# Patient Record
Sex: Male | Born: 1945 | Race: White | Hispanic: No
Health system: Southern US, Community
[De-identification: ages and names within clinical notes are randomized; demographics above are authoritative.]

---

## 2008-03-21 ENCOUNTER — Ambulatory Visit (HOSPITAL_COMMUNITY): Admission: RE | Admit: 2008-03-21 | Discharge: 2008-03-21 | Payer: Self-pay | Admitting: Urology

## 2010-01-06 IMAGING — CR DG ABDOMEN 1V
1 series · 1 of 1 positions shown · non-contrast
Comparison: None

CLINICAL DATA: Left renal calculus.  Preoperative exam.

ABDOMEN - 1 VIEW

[t abdomen supine]
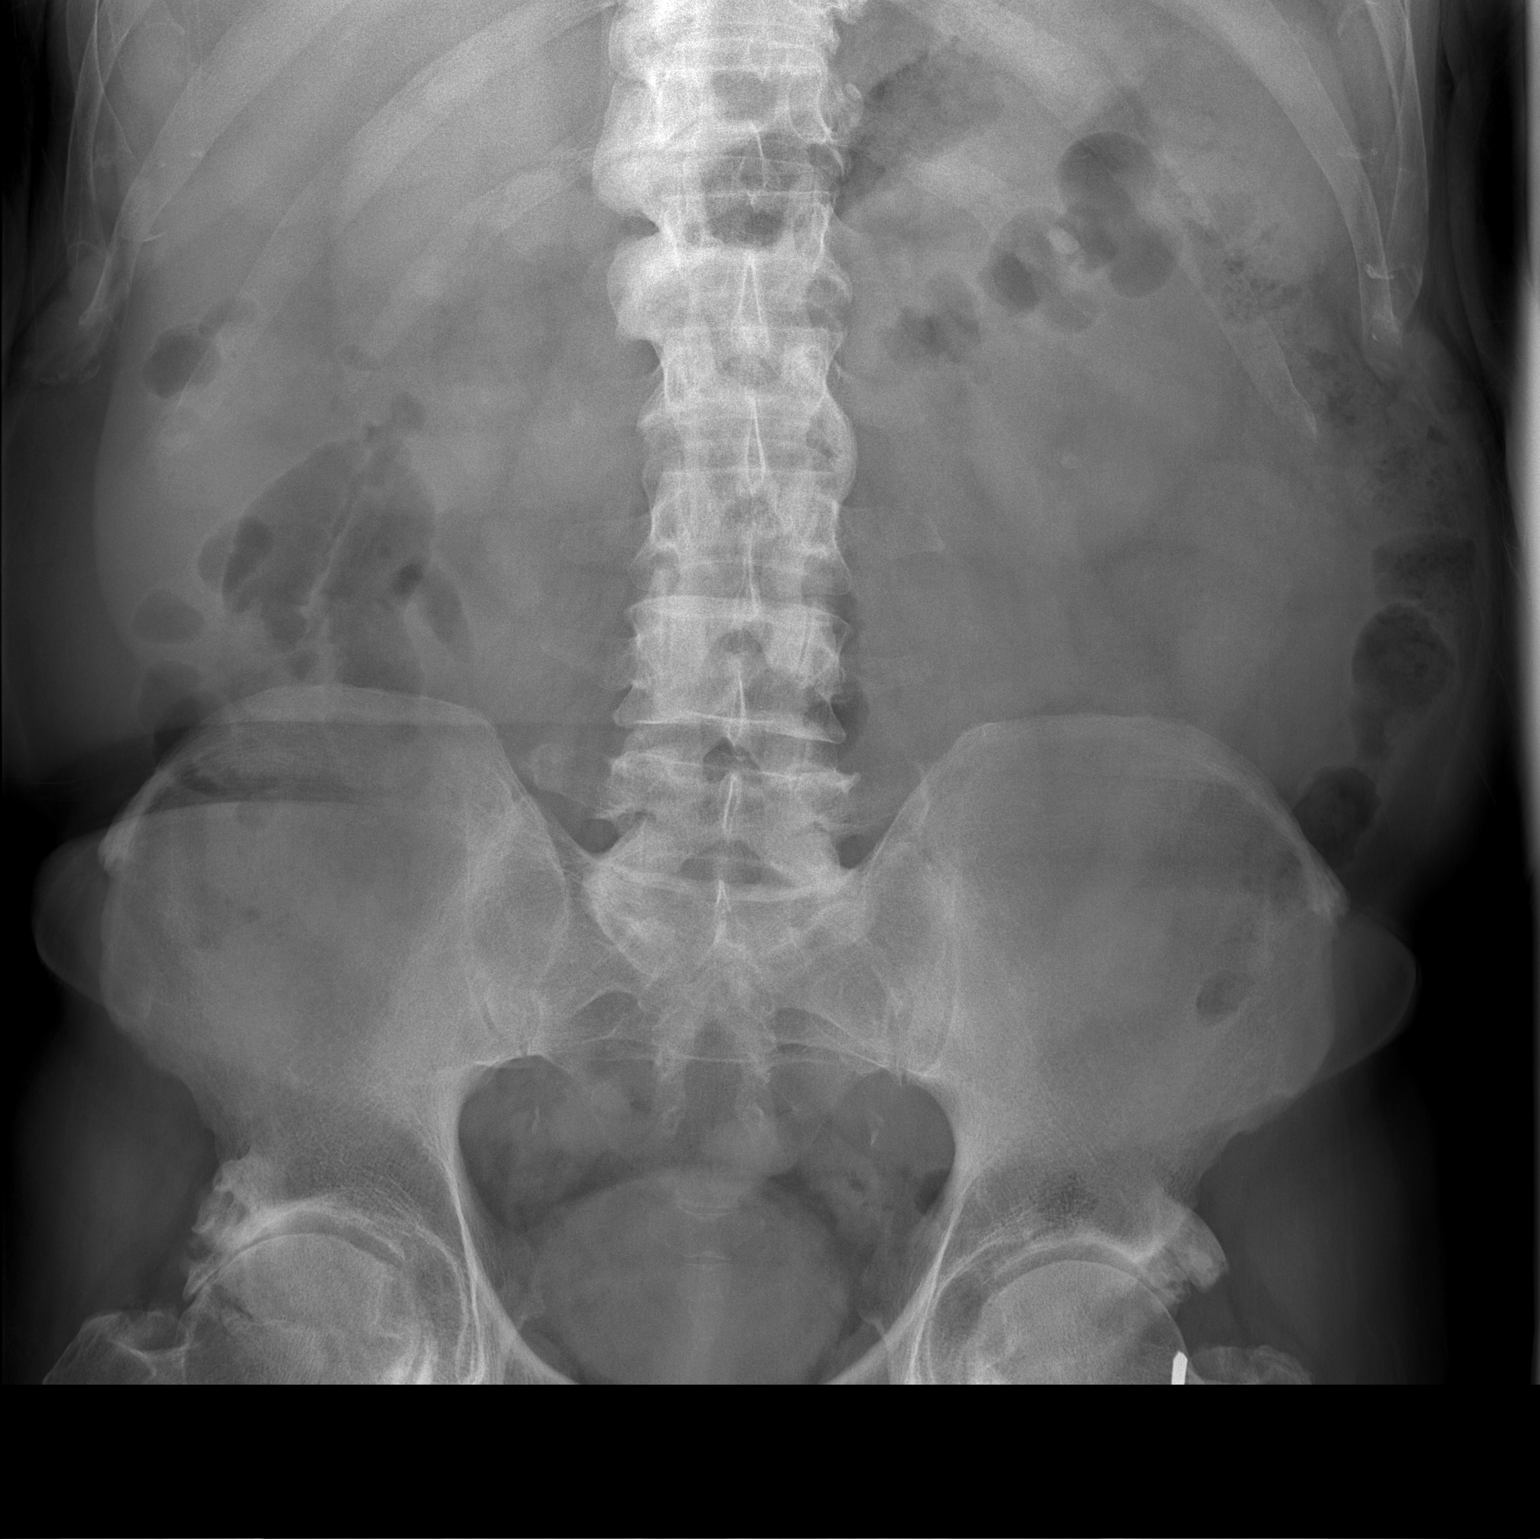

[1 of 1 positions shown; findings below may reference images not displayed]

FINDINGS: A 5 mm calcification overlying the lower pole left kidney
is compatible with a renal calculus.
No other definite calcifications are noted overlying the renal
shadows or expected course of the ureters, but correlate clinically
with any other studies.
The bowel gas pattern is unremarkable.
Moderate to severe degenerative changes within both hips are noted.
IMPRESSION: 5 mm left lower pole renal calculus.

## 2013-11-15 ENCOUNTER — Other Ambulatory Visit: Payer: Self-pay | Admitting: Internal Medicine

## 2013-11-15 DIAGNOSIS — R933 Abnormal findings on diagnostic imaging of other parts of digestive tract: Secondary | ICD-10-CM

## 2013-11-24 ENCOUNTER — Other Ambulatory Visit: Payer: Self-pay | Admitting: Internal Medicine

## 2013-11-24 DIAGNOSIS — R933 Abnormal findings on diagnostic imaging of other parts of digestive tract: Secondary | ICD-10-CM

## 2013-11-24 DIAGNOSIS — Y842 Radiological procedure and radiotherapy as the cause of abnormal reaction of the patient, or of later complication, without mention of misadventure at the time of the procedure: Secondary | ICD-10-CM

## 2013-12-01 ENCOUNTER — Ambulatory Visit
Admission: RE | Admit: 2013-12-01 | Discharge: 2013-12-01 | Disposition: A | Discharge status: Home / Self Care | Payer: Self-pay | Source: Ambulatory Visit | Attending: Internal Medicine | Admitting: Internal Medicine

## 2013-12-01 DIAGNOSIS — Y842 Radiological procedure and radiotherapy as the cause of abnormal reaction of the patient, or of later complication, without mention of misadventure at the time of the procedure: Secondary | ICD-10-CM

## 2013-12-01 DIAGNOSIS — R933 Abnormal findings on diagnostic imaging of other parts of digestive tract: Secondary | ICD-10-CM

## 2014-04-05 NOTE — Addendum Note (Signed)
Encounter addended by: Kellie MoorMichelle Monia Timmers, Rad Tech on: 04/05/2014  8:14 AM<BR>     Documentation filed: Orders

## 2015-09-18 IMAGING — CT CT VIRTUAL COLONOSCOPY SCREENING
3 of 7 series · 12 of 36 positions shown, 18 images · non-contrast
Comparison: None.

CLINICAL DATA: History of adenomatous polyps. History of recent
incomplete colonoscopy

EXAM:
CT VIRTUAL COLONOSCOPY SCREENING
TECHNIQUE: The patient was given a standard magnesium citrate and suppositories
bowel preparation with Gastrografin and barium for fluid and stool
tagging respectively. The quality of the bowel preparation is
moderate to poor. Automated CO2 insufflation of the colon was
performed prior to image acquisition and colonic distention is
moderate. Image post processing was used to generate a 3D
endoluminal fly-through projection of the colon and to
electronically subtract stool/fluid as appropriate.

[Series 6: prone (id) · axial · 0.77mm/px · z∈[-448,-26]mm · 8 of 436 slices shown, 13 images (1 of 2)]
[im 49/436  soft-tissue]
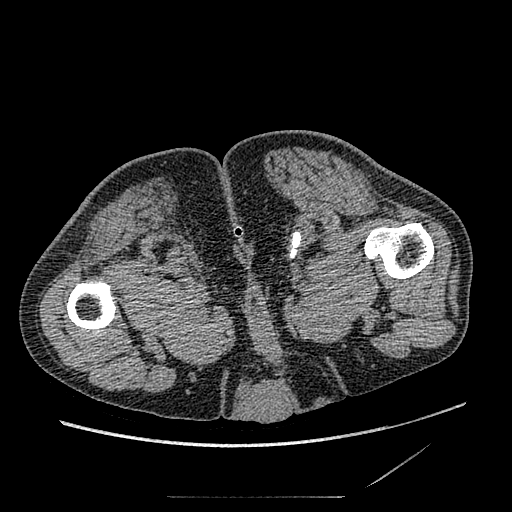
[im 49/436  bone]
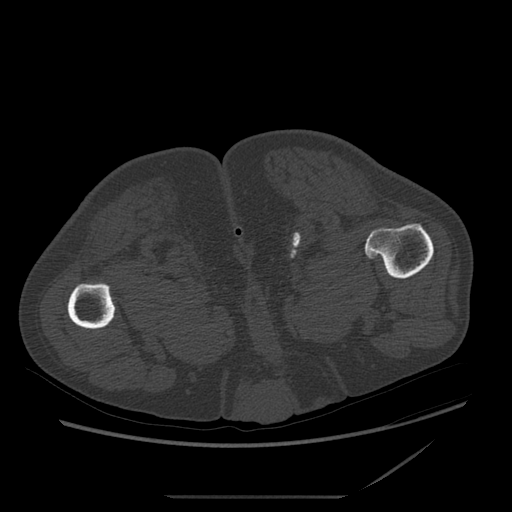
[im 97/436  soft-tissue]
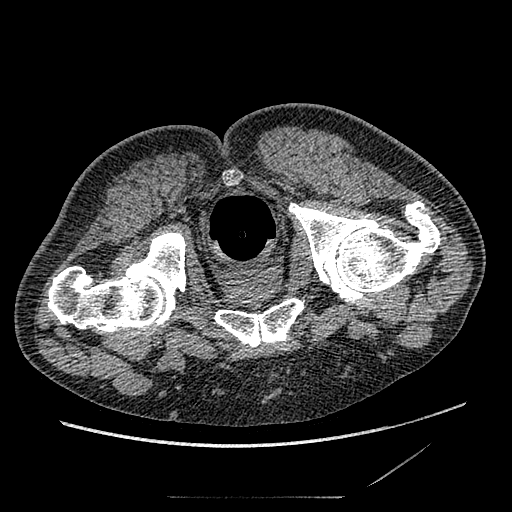
[im 146/436  soft-tissue]
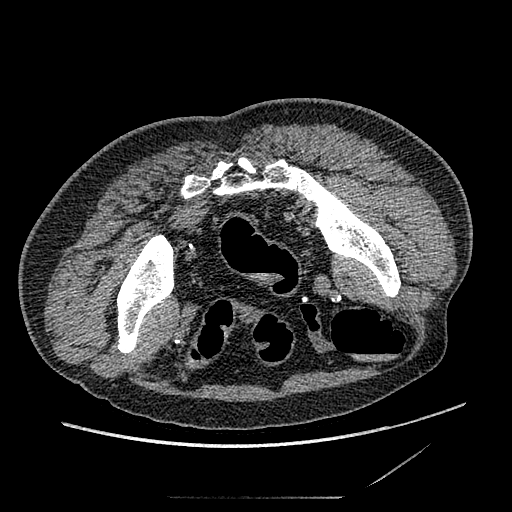
[im 194/436  soft-tissue]
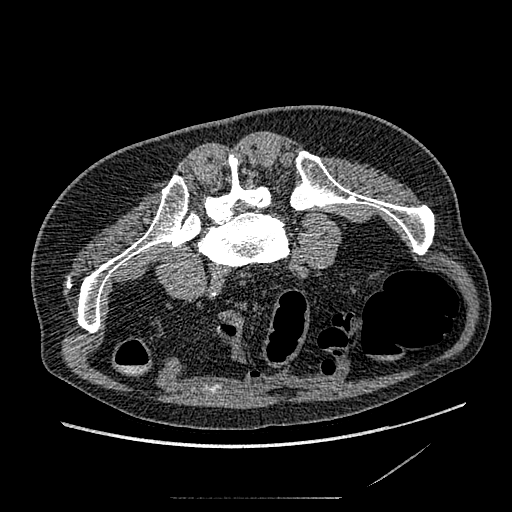
[im 242/436  soft-tissue]
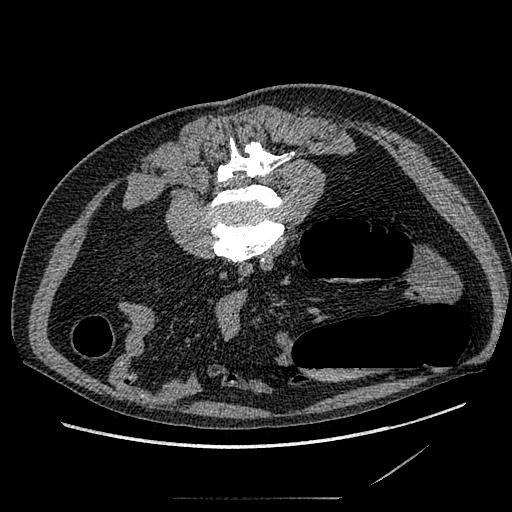
[im 242/436  lung]
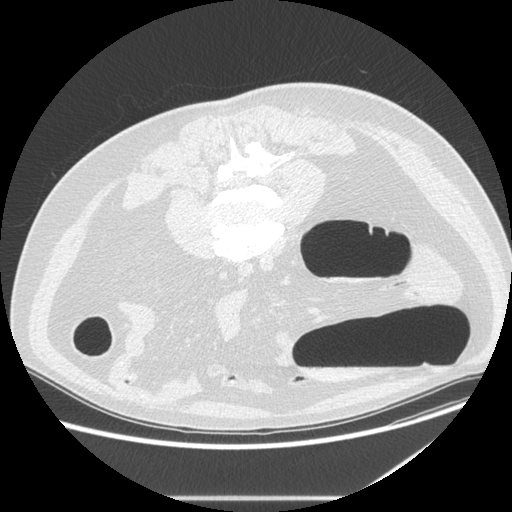
[im 291/436  soft-tissue]
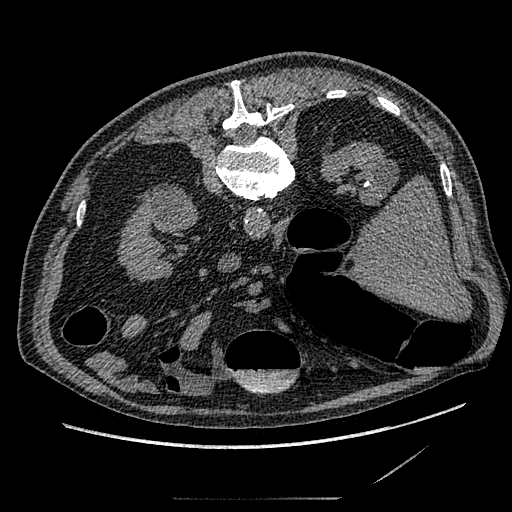
[im 291/436  lung]
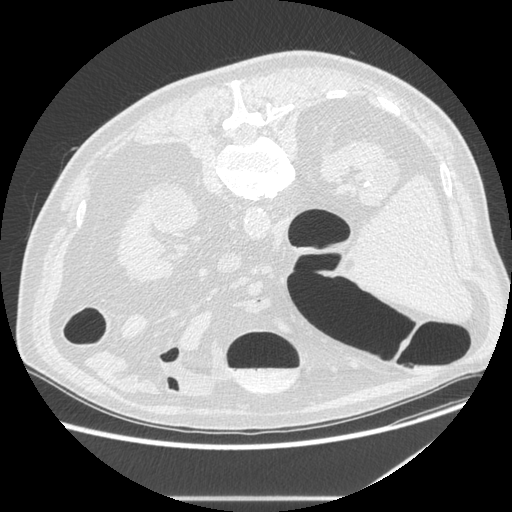
[im 339/436  soft-tissue]
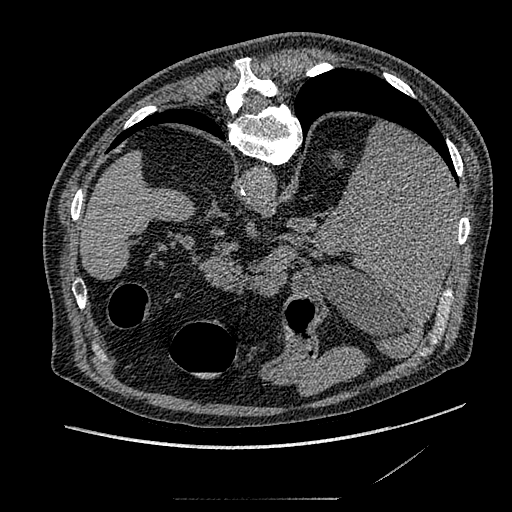
[im 339/436  lung]
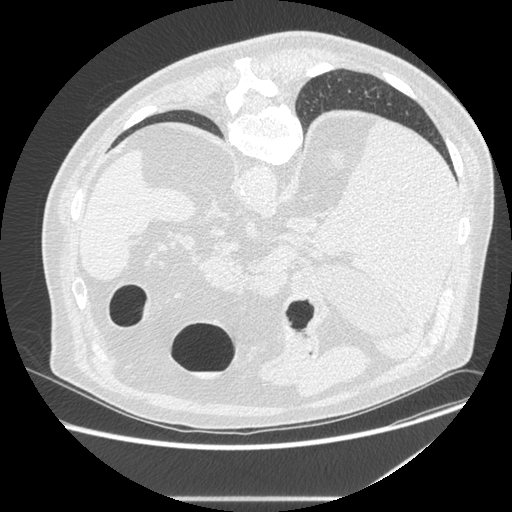
[im 387/436  soft-tissue]
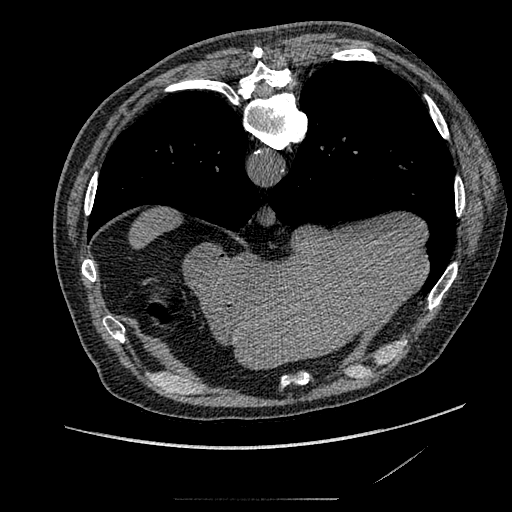
[im 387/436  lung]
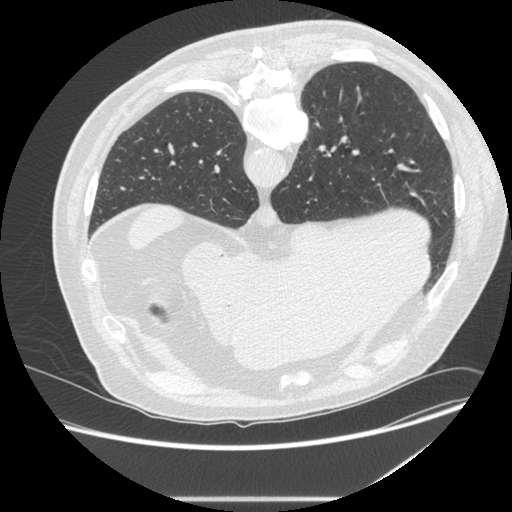

[Series 7: prone (id) · axial · 0.77mm/px · z∈[-372,-102]mm · 3 of 218 slices shown (2 of 2)]
[im 55/218  soft-tissue]
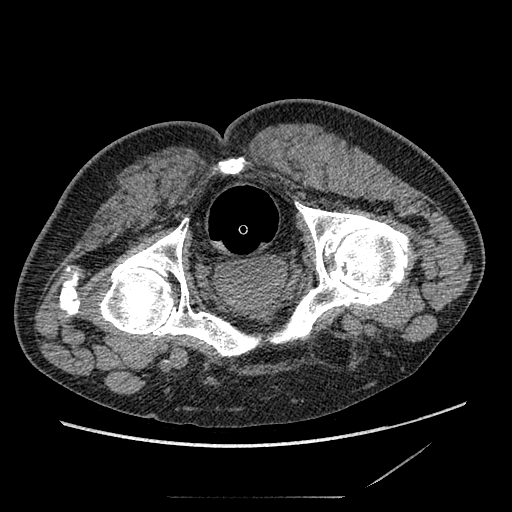
[im 109/218  soft-tissue]
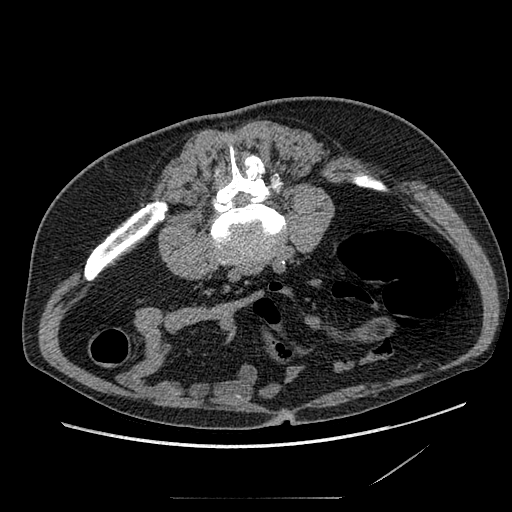
[im 163/218  soft-tissue]
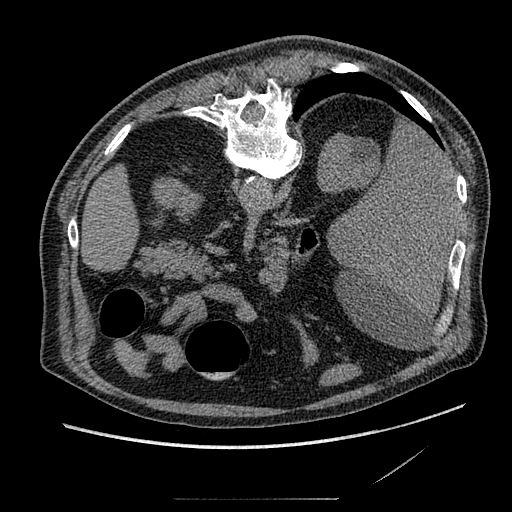

[Series 601: coronal body · coronal · 0.96mm/px · 1 of 126 slices shown, 2 images]
[im 42/126  soft-tissue]
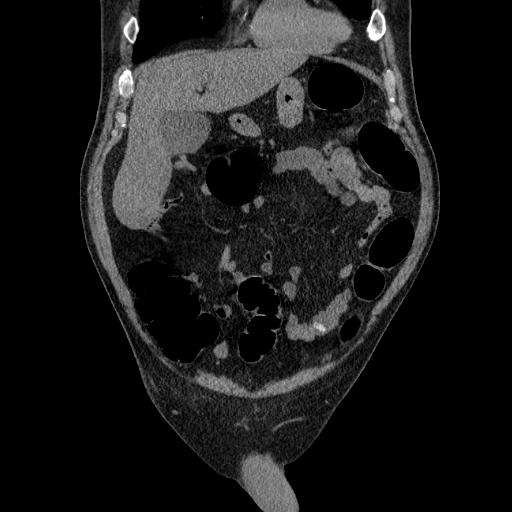
[im 42/126  bone]
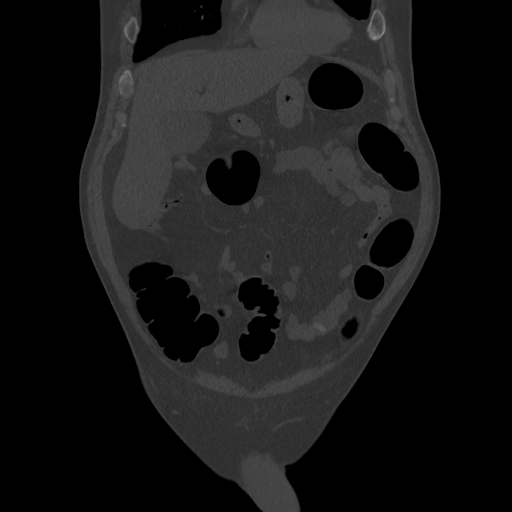

[12 of 36 positions shown; findings below may reference images not displayed]

FINDINGS: The rectosigmoid colon is somewhat elongated and tortuous. There is
some thickening of the mucosa possibly indicating mild
diverticulosis. There is unfortunately a moderate amount of retained
fluid and some feces, all of which appears relatively well tagged.
This makes exclusion of small polyps difficult. However no
clinically significant polypoid lesion or constricting lesion is
seen. The transverse colon and right colon are well visualized with
no suspicious abnormality noted. The ileocecal valve appears normal.
IMPRESSION: There is a moderate amount of retained fluid and some feces which
makes exclusion of small polyps difficult particularly within the
rectosigmoid and descending colon. However no clinically significant
polypoid lesion persists. The rectosigmoid colon is elongated and
tortuous.

Virtual colonoscopy is not designed to detect diminutive polyps
(i.e., less than or equal to 5 mm), the presence or absence of which
may not affect clinical management.

CT ABDOMEN AND PELVIS WITHOUT CONTRAST
FINDINGS: The lung bases are clear. The liver is unremarkable in the
unenhanced state. The gallbladder is somewhat distended but no
calcified gallstones are noted. The pancreas is normal in size and
the pancreatic duct is not dilated. The adrenal glands and spleen
are unremarkable. The stomach is not well distended. There are
bilateral renal calculi present which are nonobstructing.
Low-attenuation renal lesions are noted bilaterally most consistent
with cysts, but difficult to assess on this unenhanced study. The
abdominal aorta is normal in caliber with moderate atheromatous
change present. No adenopathy is seen.

The urinary bladder is decompressed. The prostate is slightly
prominent. The terminal ileum is unremarkable. The appendix also
fills with contrast normally. There are moderate degenerative
changes in both hips with loss of joint space and spurring. Diffuse
degenerative changes noted throughout the lumbar spine.

1. Nonobstructing bilateral renal calculi.
2. Low-attenuation renal lesions bilaterally most consistent with
cysts, but difficult to assess on this unenhanced study.
3. Slight prominence of the prostate.
4. Degenerative changes in both hips and in the lumbar spine.

## 2016-01-30 DIAGNOSIS — G9341 Metabolic encephalopathy: Secondary | ICD-10-CM

## 2016-01-30 DIAGNOSIS — S39012A Strain of muscle, fascia and tendon of lower back, initial encounter: Secondary | ICD-10-CM

## 2016-01-30 DIAGNOSIS — N390 Urinary tract infection, site not specified: Secondary | ICD-10-CM | POA: Diagnosis not present

## 2016-01-30 DIAGNOSIS — D696 Thrombocytopenia, unspecified: Secondary | ICD-10-CM

## 2016-01-30 DIAGNOSIS — E860 Dehydration: Secondary | ICD-10-CM

## 2016-01-30 DIAGNOSIS — W19XXXA Unspecified fall, initial encounter: Secondary | ICD-10-CM

## 2016-01-31 DIAGNOSIS — N390 Urinary tract infection, site not specified: Secondary | ICD-10-CM | POA: Diagnosis not present

## 2016-01-31 DIAGNOSIS — E860 Dehydration: Secondary | ICD-10-CM | POA: Diagnosis not present

## 2016-01-31 DIAGNOSIS — S39012A Strain of muscle, fascia and tendon of lower back, initial encounter: Secondary | ICD-10-CM | POA: Diagnosis not present

## 2016-01-31 DIAGNOSIS — D696 Thrombocytopenia, unspecified: Secondary | ICD-10-CM | POA: Diagnosis not present

## 2016-02-01 DIAGNOSIS — F0390 Unspecified dementia without behavioral disturbance: Secondary | ICD-10-CM

## 2016-02-01 DIAGNOSIS — N390 Urinary tract infection, site not specified: Secondary | ICD-10-CM | POA: Diagnosis not present

## 2016-02-01 DIAGNOSIS — D696 Thrombocytopenia, unspecified: Secondary | ICD-10-CM | POA: Diagnosis not present

## 2016-02-01 DIAGNOSIS — S39012A Strain of muscle, fascia and tendon of lower back, initial encounter: Secondary | ICD-10-CM | POA: Diagnosis not present

## 2016-02-01 DIAGNOSIS — E860 Dehydration: Secondary | ICD-10-CM | POA: Diagnosis not present

## 2016-02-01 DIAGNOSIS — G20 Parkinson's disease: Secondary | ICD-10-CM

## 2016-11-08 DIAGNOSIS — R4689 Other symptoms and signs involving appearance and behavior: Secondary | ICD-10-CM

## 2016-11-08 DIAGNOSIS — W19XXXA Unspecified fall, initial encounter: Secondary | ICD-10-CM

## 2016-11-08 DIAGNOSIS — N390 Urinary tract infection, site not specified: Secondary | ICD-10-CM | POA: Diagnosis not present

## 2016-11-08 DIAGNOSIS — G20 Parkinson's disease: Secondary | ICD-10-CM | POA: Diagnosis not present

## 2016-11-08 DIAGNOSIS — F0390 Unspecified dementia without behavioral disturbance: Secondary | ICD-10-CM | POA: Diagnosis not present

## 2016-11-08 DIAGNOSIS — G9341 Metabolic encephalopathy: Secondary | ICD-10-CM

## 2016-11-09 DIAGNOSIS — G9341 Metabolic encephalopathy: Secondary | ICD-10-CM | POA: Diagnosis not present

## 2016-11-09 DIAGNOSIS — G20 Parkinson's disease: Secondary | ICD-10-CM | POA: Diagnosis not present

## 2016-11-09 DIAGNOSIS — N390 Urinary tract infection, site not specified: Secondary | ICD-10-CM | POA: Diagnosis not present

## 2016-11-09 DIAGNOSIS — R4689 Other symptoms and signs involving appearance and behavior: Secondary | ICD-10-CM | POA: Diagnosis not present

## 2016-11-09 DIAGNOSIS — F0390 Unspecified dementia without behavioral disturbance: Secondary | ICD-10-CM | POA: Diagnosis not present

## 2016-11-09 DIAGNOSIS — W19XXXA Unspecified fall, initial encounter: Secondary | ICD-10-CM | POA: Diagnosis not present

## 2016-11-10 DIAGNOSIS — N390 Urinary tract infection, site not specified: Secondary | ICD-10-CM | POA: Diagnosis not present

## 2016-11-10 DIAGNOSIS — G9341 Metabolic encephalopathy: Secondary | ICD-10-CM | POA: Diagnosis not present

## 2016-11-10 DIAGNOSIS — R4689 Other symptoms and signs involving appearance and behavior: Secondary | ICD-10-CM | POA: Diagnosis not present

## 2016-11-10 DIAGNOSIS — F0390 Unspecified dementia without behavioral disturbance: Secondary | ICD-10-CM | POA: Diagnosis not present

## 2016-11-10 DIAGNOSIS — W19XXXA Unspecified fall, initial encounter: Secondary | ICD-10-CM | POA: Diagnosis not present

## 2016-11-10 DIAGNOSIS — G20 Parkinson's disease: Secondary | ICD-10-CM | POA: Diagnosis not present

## 2016-11-11 DIAGNOSIS — N390 Urinary tract infection, site not specified: Secondary | ICD-10-CM | POA: Diagnosis not present

## 2016-11-11 DIAGNOSIS — R4689 Other symptoms and signs involving appearance and behavior: Secondary | ICD-10-CM | POA: Diagnosis not present

## 2016-11-11 DIAGNOSIS — F0390 Unspecified dementia without behavioral disturbance: Secondary | ICD-10-CM | POA: Diagnosis not present

## 2016-11-11 DIAGNOSIS — G9341 Metabolic encephalopathy: Secondary | ICD-10-CM | POA: Diagnosis not present

## 2016-11-11 DIAGNOSIS — G20 Parkinson's disease: Secondary | ICD-10-CM | POA: Diagnosis not present

## 2016-11-11 DIAGNOSIS — W19XXXA Unspecified fall, initial encounter: Secondary | ICD-10-CM | POA: Diagnosis not present

## 2017-01-16 DIAGNOSIS — G9341 Metabolic encephalopathy: Secondary | ICD-10-CM | POA: Diagnosis not present

## 2017-01-16 DIAGNOSIS — R4689 Other symptoms and signs involving appearance and behavior: Secondary | ICD-10-CM | POA: Diagnosis not present

## 2017-01-16 DIAGNOSIS — F0390 Unspecified dementia without behavioral disturbance: Secondary | ICD-10-CM | POA: Diagnosis not present

## 2017-01-16 DIAGNOSIS — E860 Dehydration: Secondary | ICD-10-CM | POA: Diagnosis not present

## 2017-01-16 DIAGNOSIS — D696 Thrombocytopenia, unspecified: Secondary | ICD-10-CM | POA: Diagnosis not present

## 2017-01-16 DIAGNOSIS — G20 Parkinson's disease: Secondary | ICD-10-CM | POA: Diagnosis not present

## 2017-01-16 DIAGNOSIS — J159 Unspecified bacterial pneumonia: Secondary | ICD-10-CM | POA: Diagnosis not present

## 2017-01-16 DIAGNOSIS — N390 Urinary tract infection, site not specified: Secondary | ICD-10-CM

## 2017-01-16 DIAGNOSIS — R410 Disorientation, unspecified: Secondary | ICD-10-CM | POA: Diagnosis not present

## 2017-01-17 DIAGNOSIS — J159 Unspecified bacterial pneumonia: Secondary | ICD-10-CM | POA: Diagnosis not present

## 2017-01-17 DIAGNOSIS — R410 Disorientation, unspecified: Secondary | ICD-10-CM | POA: Diagnosis not present

## 2017-01-17 DIAGNOSIS — G20 Parkinson's disease: Secondary | ICD-10-CM | POA: Diagnosis not present

## 2017-01-17 DIAGNOSIS — D696 Thrombocytopenia, unspecified: Secondary | ICD-10-CM | POA: Diagnosis not present

## 2017-01-17 DIAGNOSIS — N390 Urinary tract infection, site not specified: Secondary | ICD-10-CM | POA: Diagnosis not present

## 2017-01-17 DIAGNOSIS — E860 Dehydration: Secondary | ICD-10-CM | POA: Diagnosis not present

## 2017-01-17 DIAGNOSIS — R4689 Other symptoms and signs involving appearance and behavior: Secondary | ICD-10-CM | POA: Diagnosis not present

## 2017-01-17 DIAGNOSIS — F0390 Unspecified dementia without behavioral disturbance: Secondary | ICD-10-CM | POA: Diagnosis not present

## 2017-01-17 DIAGNOSIS — G9341 Metabolic encephalopathy: Secondary | ICD-10-CM | POA: Diagnosis not present

## 2017-01-18 DIAGNOSIS — R410 Disorientation, unspecified: Secondary | ICD-10-CM | POA: Diagnosis not present

## 2017-01-18 DIAGNOSIS — J159 Unspecified bacterial pneumonia: Secondary | ICD-10-CM | POA: Diagnosis not present

## 2017-01-18 DIAGNOSIS — D696 Thrombocytopenia, unspecified: Secondary | ICD-10-CM | POA: Diagnosis not present

## 2017-01-18 DIAGNOSIS — R4689 Other symptoms and signs involving appearance and behavior: Secondary | ICD-10-CM | POA: Diagnosis not present

## 2017-01-18 DIAGNOSIS — E860 Dehydration: Secondary | ICD-10-CM | POA: Diagnosis not present

## 2017-01-18 DIAGNOSIS — N390 Urinary tract infection, site not specified: Secondary | ICD-10-CM | POA: Diagnosis not present

## 2017-01-18 DIAGNOSIS — F0390 Unspecified dementia without behavioral disturbance: Secondary | ICD-10-CM | POA: Diagnosis not present

## 2017-01-18 DIAGNOSIS — G20 Parkinson's disease: Secondary | ICD-10-CM | POA: Diagnosis not present

## 2017-01-18 DIAGNOSIS — G9341 Metabolic encephalopathy: Secondary | ICD-10-CM | POA: Diagnosis not present

## 2017-01-19 DIAGNOSIS — R410 Disorientation, unspecified: Secondary | ICD-10-CM | POA: Diagnosis not present

## 2017-01-19 DIAGNOSIS — R4689 Other symptoms and signs involving appearance and behavior: Secondary | ICD-10-CM | POA: Diagnosis not present

## 2017-01-19 DIAGNOSIS — J159 Unspecified bacterial pneumonia: Secondary | ICD-10-CM | POA: Diagnosis not present

## 2017-01-19 DIAGNOSIS — D696 Thrombocytopenia, unspecified: Secondary | ICD-10-CM | POA: Diagnosis not present

## 2017-01-19 DIAGNOSIS — G20 Parkinson's disease: Secondary | ICD-10-CM | POA: Diagnosis not present

## 2017-01-19 DIAGNOSIS — F0390 Unspecified dementia without behavioral disturbance: Secondary | ICD-10-CM | POA: Diagnosis not present

## 2017-01-19 DIAGNOSIS — E860 Dehydration: Secondary | ICD-10-CM | POA: Diagnosis not present

## 2017-01-19 DIAGNOSIS — G9341 Metabolic encephalopathy: Secondary | ICD-10-CM | POA: Diagnosis not present

## 2017-01-19 DIAGNOSIS — N390 Urinary tract infection, site not specified: Secondary | ICD-10-CM | POA: Diagnosis not present

## 2017-01-20 DIAGNOSIS — N390 Urinary tract infection, site not specified: Secondary | ICD-10-CM | POA: Diagnosis not present

## 2017-01-20 DIAGNOSIS — R410 Disorientation, unspecified: Secondary | ICD-10-CM | POA: Diagnosis not present

## 2017-01-20 DIAGNOSIS — J159 Unspecified bacterial pneumonia: Secondary | ICD-10-CM | POA: Diagnosis not present

## 2017-01-20 DIAGNOSIS — G9341 Metabolic encephalopathy: Secondary | ICD-10-CM | POA: Diagnosis not present

## 2017-01-20 DIAGNOSIS — E860 Dehydration: Secondary | ICD-10-CM | POA: Diagnosis not present

## 2017-01-20 DIAGNOSIS — D696 Thrombocytopenia, unspecified: Secondary | ICD-10-CM | POA: Diagnosis not present

## 2017-01-20 DIAGNOSIS — G20 Parkinson's disease: Secondary | ICD-10-CM | POA: Diagnosis not present

## 2017-01-20 DIAGNOSIS — F0390 Unspecified dementia without behavioral disturbance: Secondary | ICD-10-CM | POA: Diagnosis not present

## 2017-01-20 DIAGNOSIS — R4689 Other symptoms and signs involving appearance and behavior: Secondary | ICD-10-CM | POA: Diagnosis not present

## 2017-03-25 DEATH — deceased
